# Patient Record
Sex: Female | Born: 1975 | Race: White | Hispanic: No | Marital: Married
Health system: Southern US, Community
[De-identification: ages and names within clinical notes are randomized; demographics above are authoritative.]

---

## 2005-10-15 ENCOUNTER — Inpatient Hospital Stay (HOSPITAL_COMMUNITY): Admission: AD | Admit: 2005-10-15 | Discharge: 2005-10-17 | Payer: Self-pay | Admitting: Obstetrics & Gynecology

## 2008-11-25 ENCOUNTER — Inpatient Hospital Stay (HOSPITAL_COMMUNITY): Admission: AD | Admit: 2008-11-25 | Discharge: 2008-11-26 | Payer: Self-pay | Admitting: Obstetrics and Gynecology

## 2010-04-19 LAB — CBC
HCT: 35.9 % — ABNORMAL LOW (ref 36.0–46.0)
HCT: 39 % (ref 36.0–46.0)
Hemoglobin: 13.6 g/dL (ref 12.0–15.0)
MCHC: 34.4 g/dL (ref 30.0–36.0)
MCHC: 34.7 g/dL (ref 30.0–36.0)
MCV: 96.8 fL (ref 78.0–100.0)
MCV: 97.1 fL (ref 78.0–100.0)
Platelets: 180 10*3/uL (ref 150–400)
RBC: 4.03 MIL/uL (ref 3.87–5.11)
RDW: 12.8 % (ref 11.5–15.5)
RDW: 13.1 % (ref 11.5–15.5)
WBC: 11.6 10*3/uL — ABNORMAL HIGH (ref 4.0–10.5)

## 2016-05-25 ENCOUNTER — Other Ambulatory Visit: Payer: Self-pay | Admitting: Obstetrics and Gynecology

## 2016-05-25 DIAGNOSIS — R928 Other abnormal and inconclusive findings on diagnostic imaging of breast: Secondary | ICD-10-CM

## 2016-06-01 ENCOUNTER — Ambulatory Visit
Admission: RE | Admit: 2016-06-01 | Discharge: 2016-06-01 | Disposition: A | Discharge status: Home / Self Care | Payer: Self-pay | Source: Ambulatory Visit | Attending: Obstetrics and Gynecology | Admitting: Obstetrics and Gynecology

## 2016-06-01 DIAGNOSIS — R928 Other abnormal and inconclusive findings on diagnostic imaging of breast: Secondary | ICD-10-CM

## 2018-03-07 IMAGING — MG 2D DIGITAL DIAGNOSTIC BILATERAL MAMMOGRAM WITH CAD AND ADJUNCT T
8 of 12 series · 8 of 28 positions shown · non-contrast
Comparison: Baseline screening mammogram dated 05/24/2016.

CLINICAL DATA: Bilateral screening recall for a right breast
asymmetry and possible left breast mass.

EXAM:
2D DIGITAL DIAGNOSTIC BILATERAL MAMMOGRAM WITH CAD AND ADJUNCT TOMO
BILATERAL BREAST ULTRASOUND

[R CC]
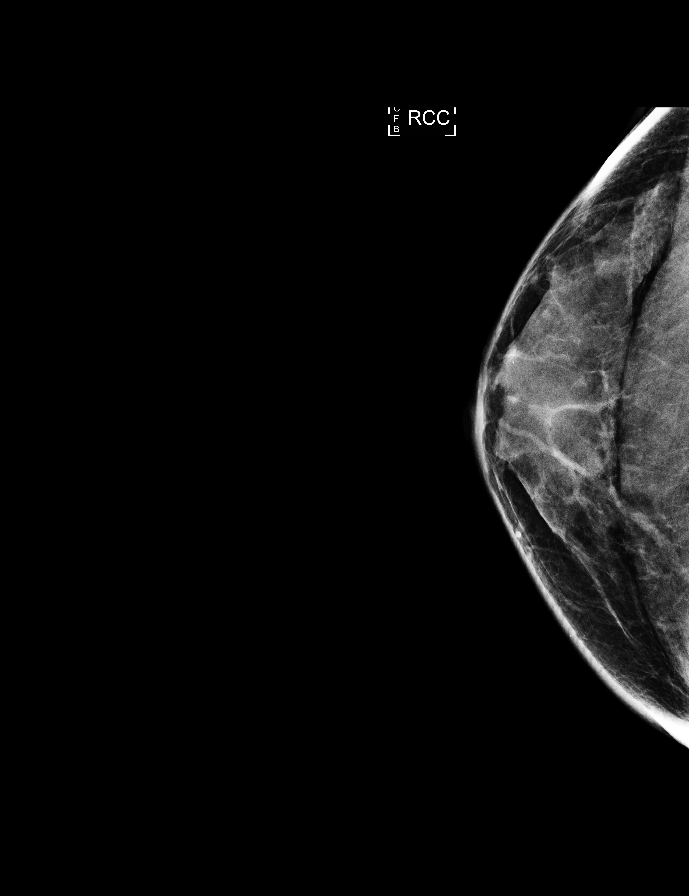

[L CC]
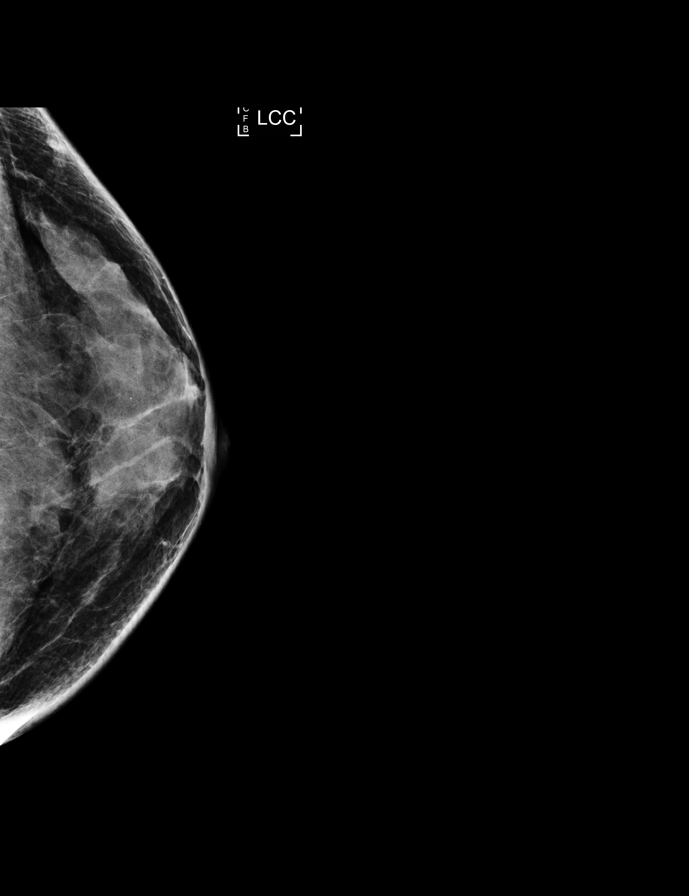

[R MLO]
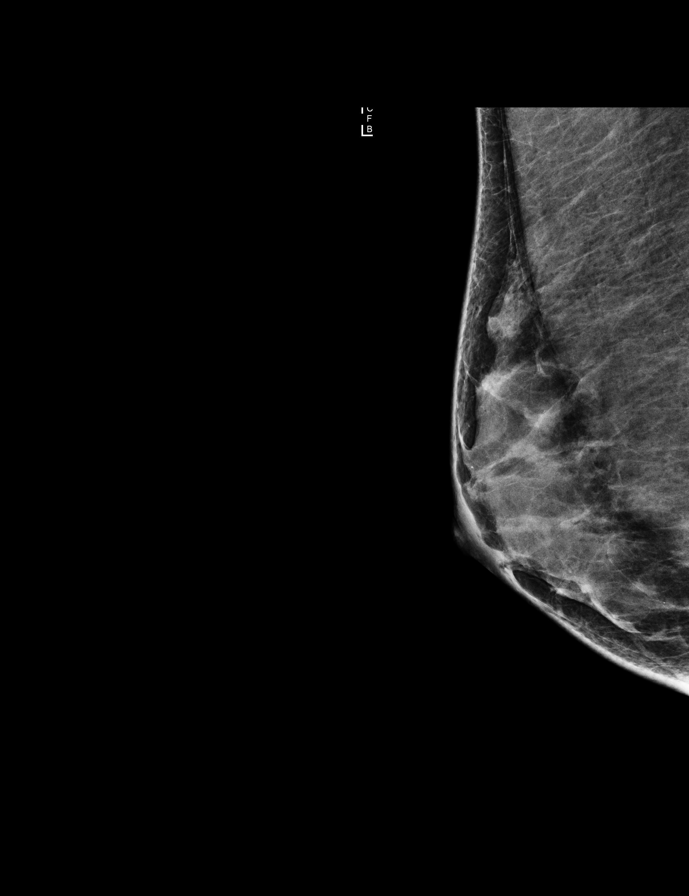

[L MLO]
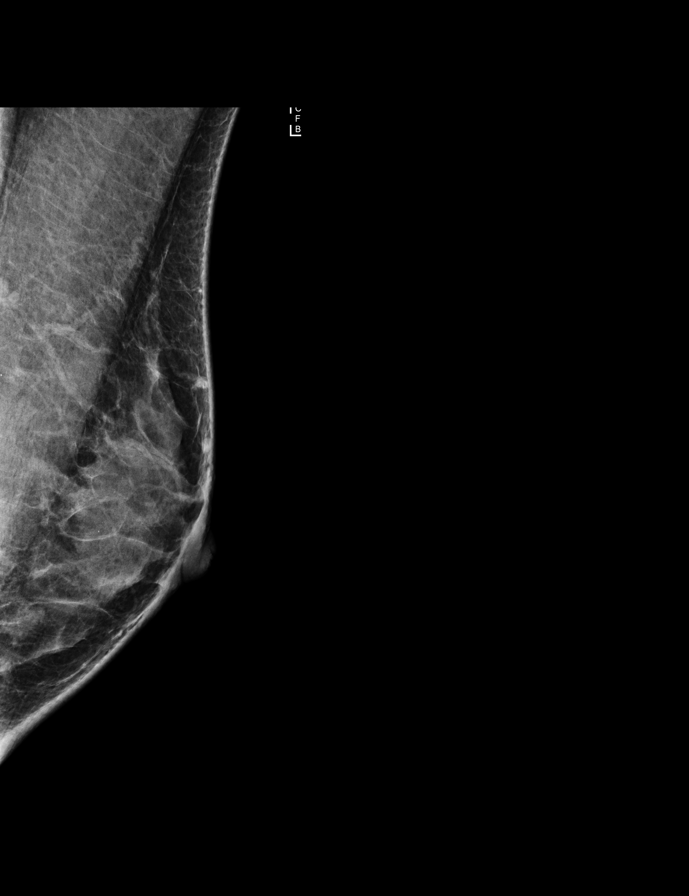

[L MLO synth-2D]
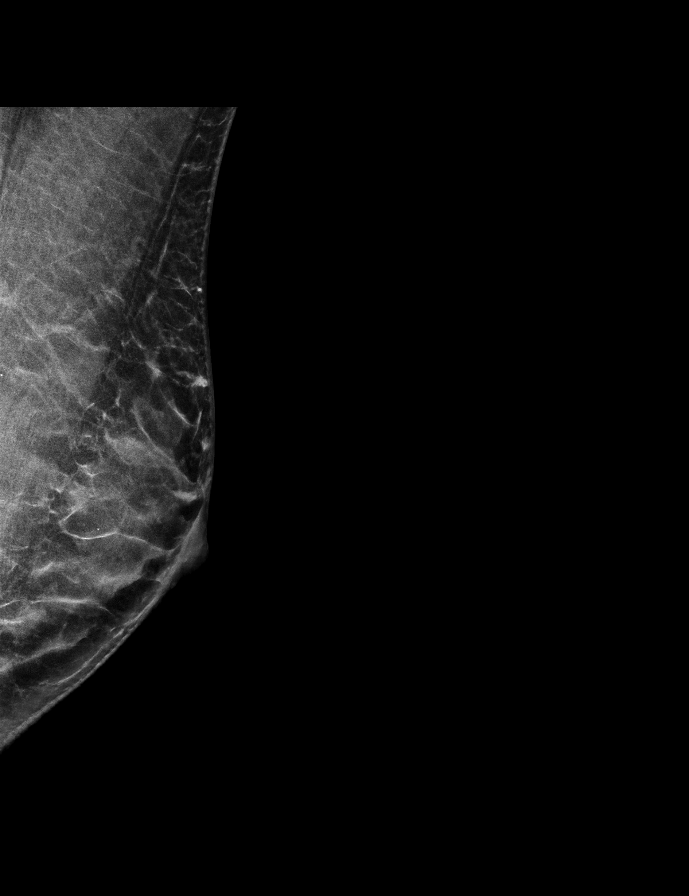

[L CC synth-2D]
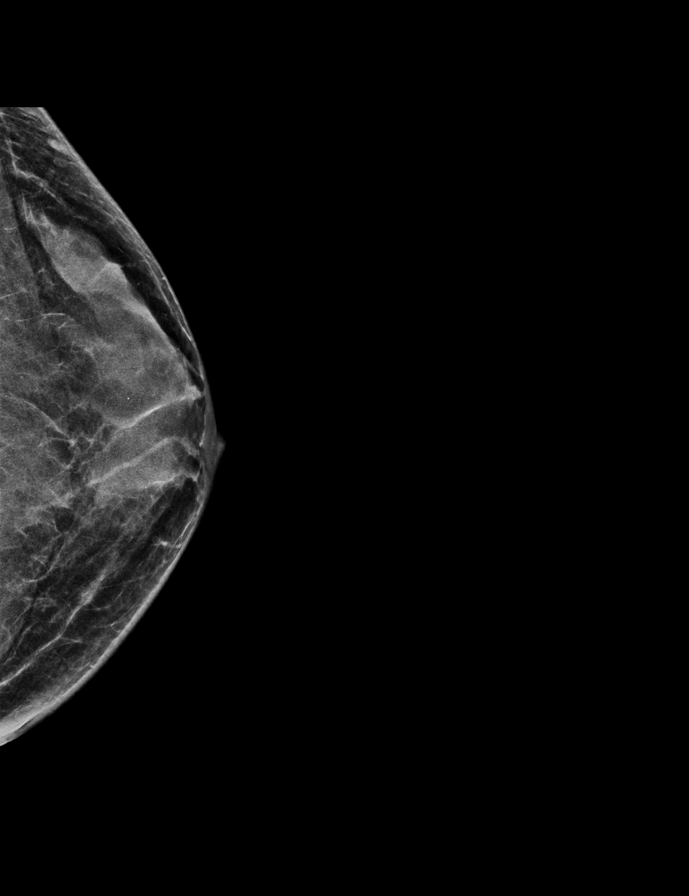

[R CC synth-2D]
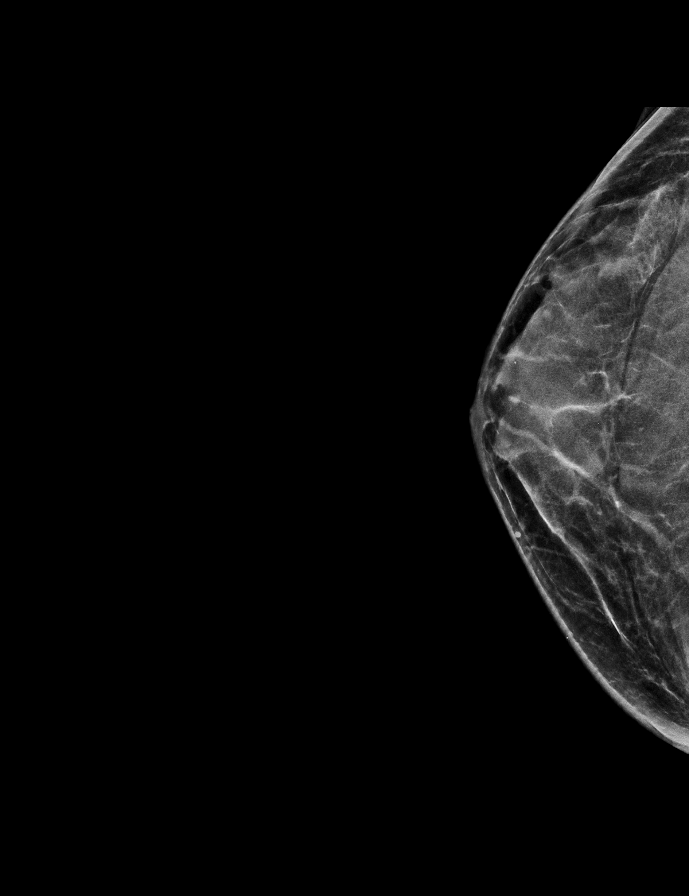

[R MLO synth-2D]
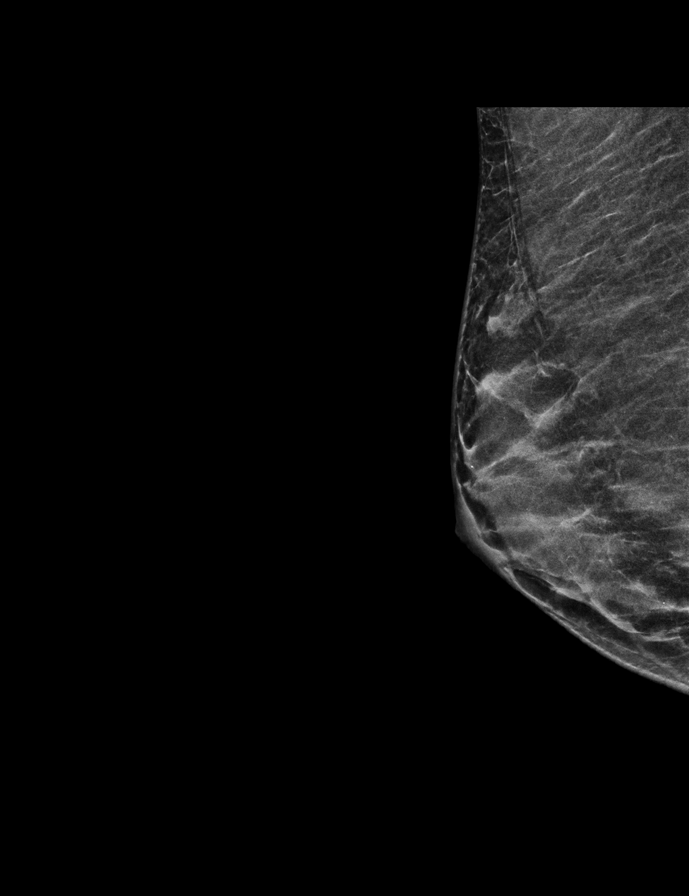

[8 of 28 positions shown; findings below may reference images not displayed]

ACR Breast Density Category d: The breast tissue is extremely dense,
which lowers the sensitivity of mammography.
FINDINGS: Ultrasound targeted to the left breast at 4 o'clock demonstrates a
superficial anechoic oval mass measuring 6 x 3 x 4 mm consistent
with a benign cyst. Ultrasound of the upper-outer quadrant of the
right breast demonstrates a benign-appearing lymph node with a
preserved fatty hilum and a cortex of normal thickness.

Mammographic images were processed with CAD.
IMPRESSION: 1. The asymmetry in the right upper outer quadrant corresponds with
dense breast tissue and a benign lymph node.

2.  The mass in the left breast corresponds with a benign cyst.

3. There is no mammographic or targeted sonographic evidence of
malignancy in the bilateral breasts.

RECOMMENDATION:
Screening mammogram in one year.(Code:R3-N-GOJ)

I have discussed the findings and recommendations with the patient.
Results were also provided in writing at the conclusion of the
visit. If applicable, a reminder letter will be sent to the patient
regarding the next appointment.

BI-RADS CATEGORY  2: Benign.

## 2018-03-07 IMAGING — US ULTRASOUND RIGHT BREAST LIMITED
1 series · 6 of 6 positions shown · non-contrast
Comparison: Baseline screening mammogram dated 05/24/2016.

CLINICAL DATA: Bilateral screening recall for a right breast
asymmetry and possible left breast mass.

EXAM:
2D DIGITAL DIAGNOSTIC BILATERAL MAMMOGRAM WITH CAD AND ADJUNCT TOMO
BILATERAL BREAST ULTRASOUND

[Series 1: ultrasound right breast limited · 0.03mm/px · 6 of 6 slices shown]
[im 1/6]
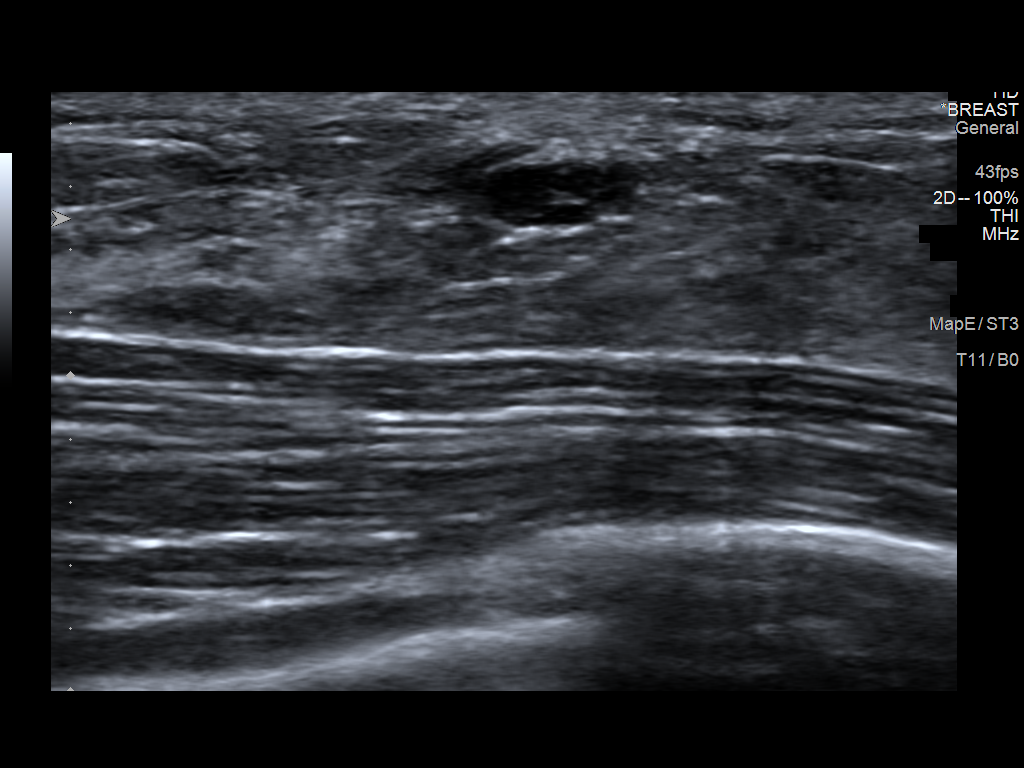
[im 2/6]
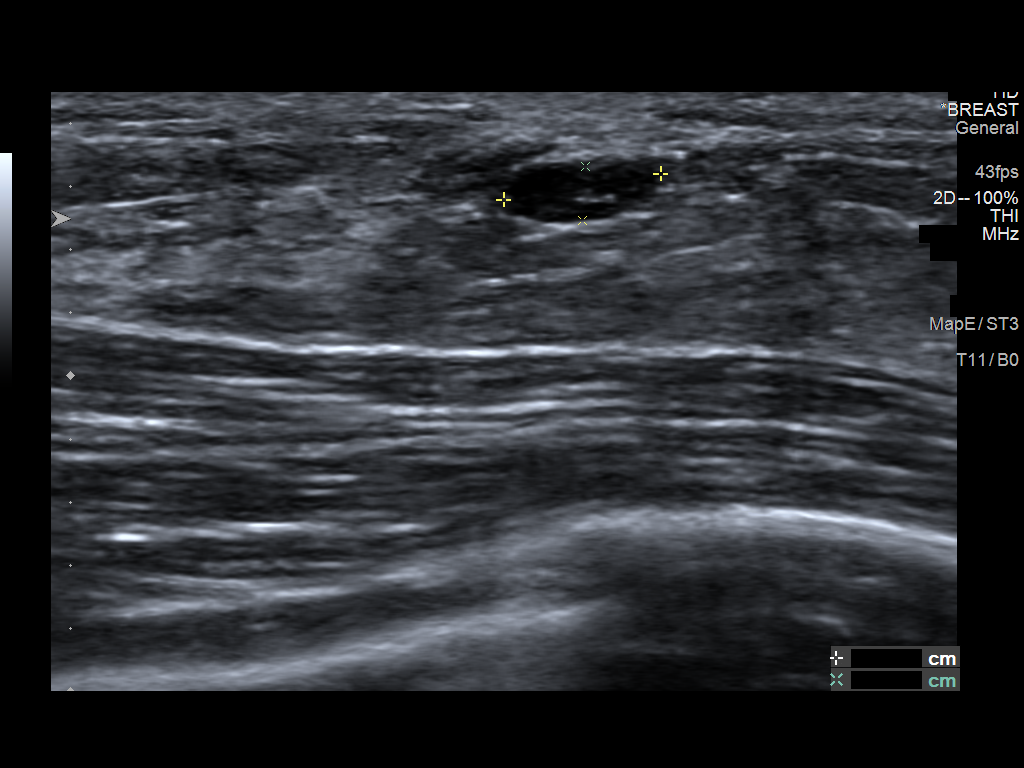
[im 3/6]
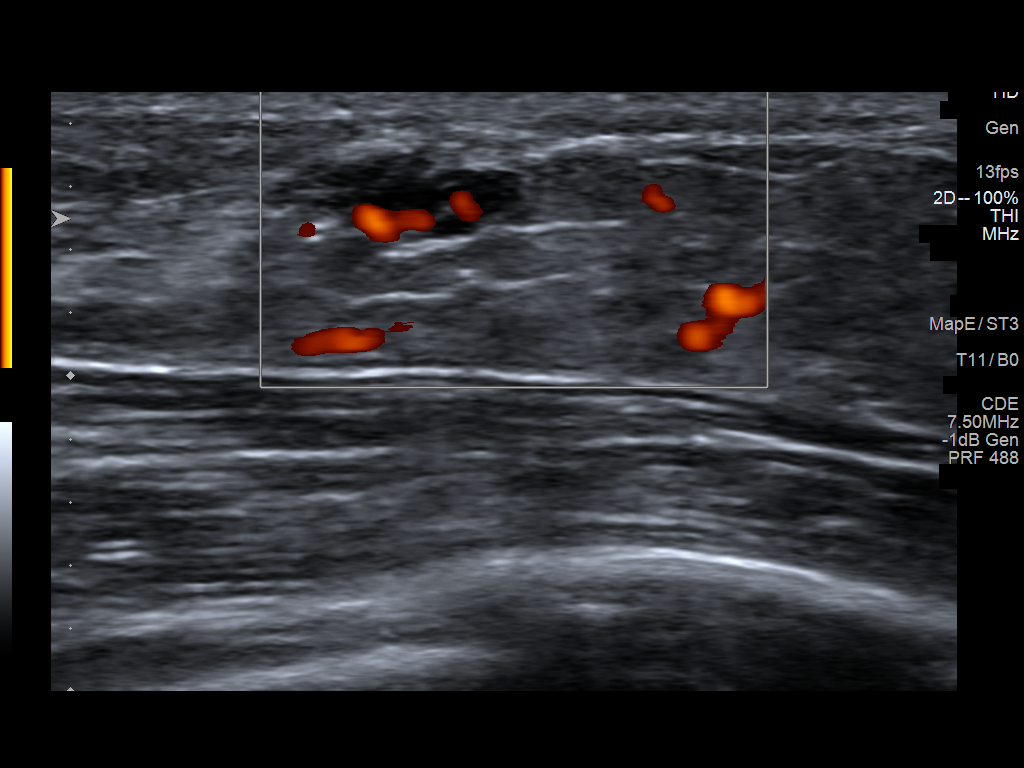
[im 4/6]
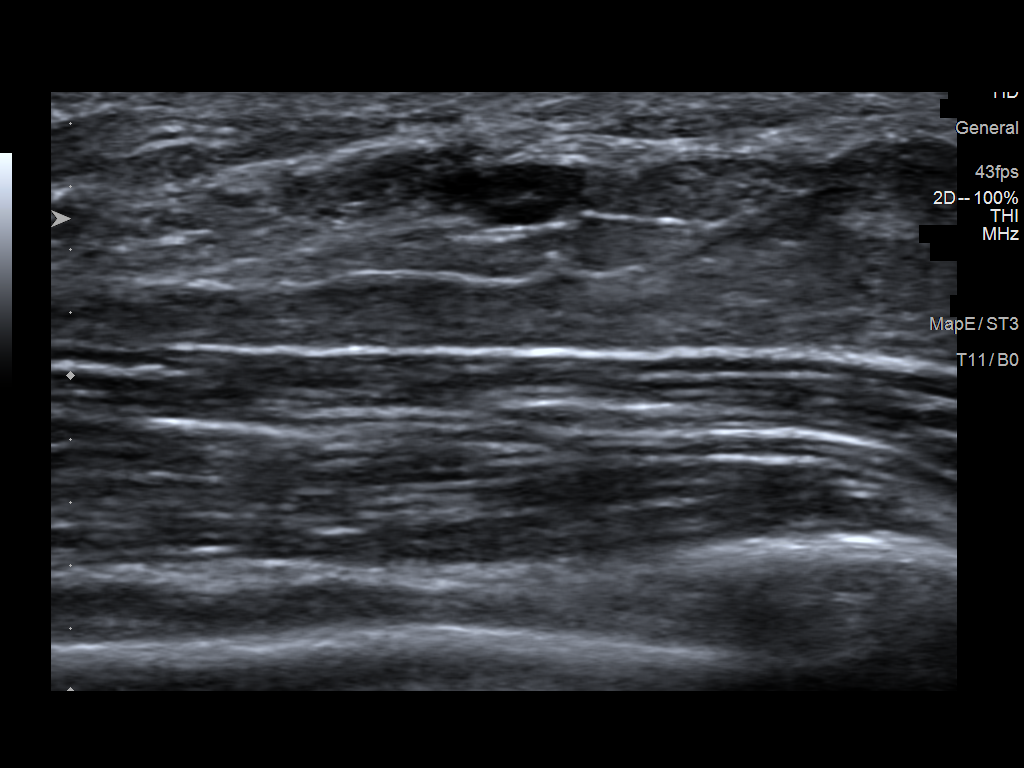
[im 5/6]
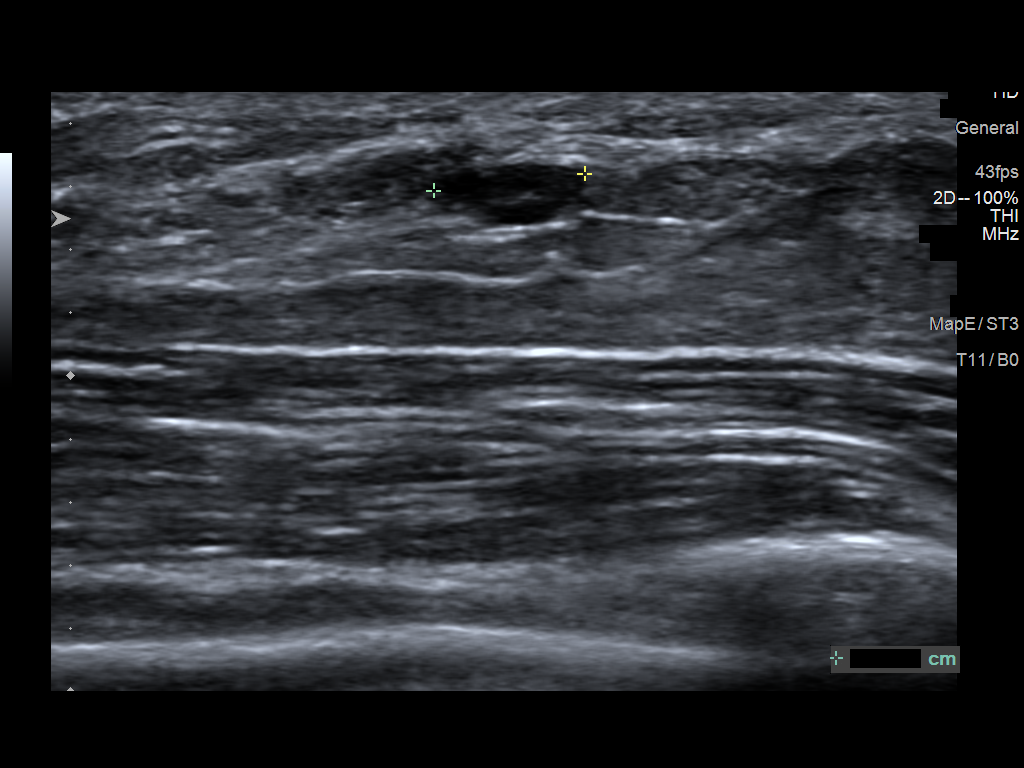
[im 6/6]
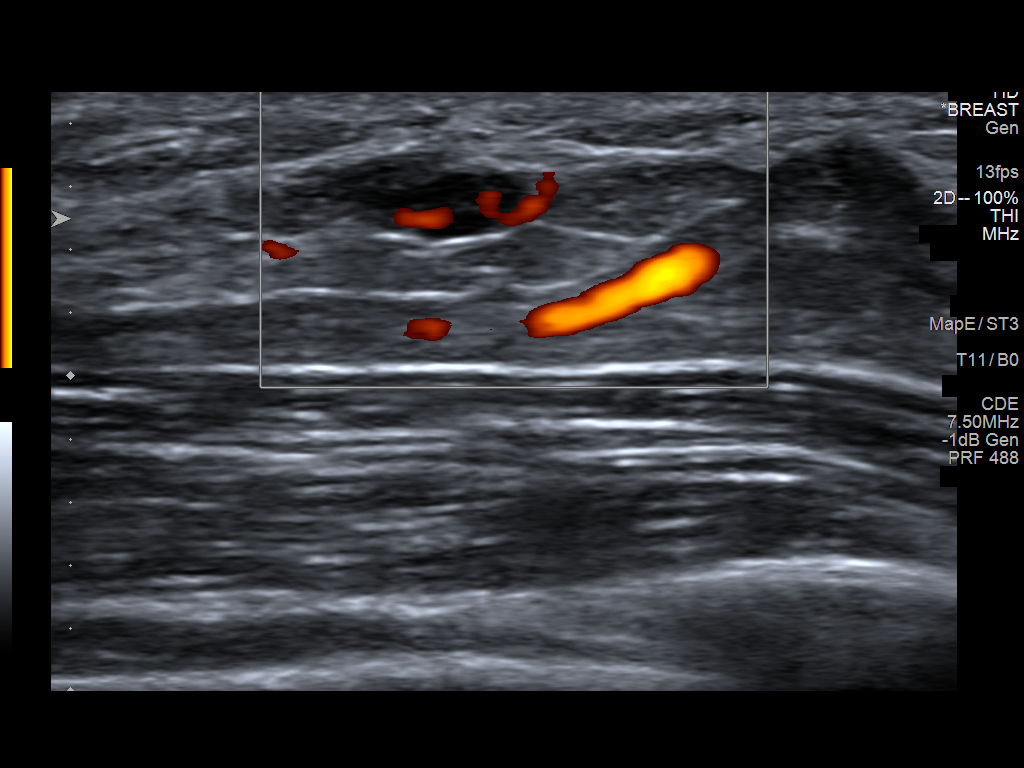

[6 of 6 positions shown; findings below may reference images not displayed]

ACR Breast Density Category d: The breast tissue is extremely dense,
which lowers the sensitivity of mammography.
FINDINGS: Ultrasound targeted to the left breast at 4 o'clock demonstrates a
superficial anechoic oval mass measuring 6 x 3 x 4 mm consistent
with a benign cyst. Ultrasound of the upper-outer quadrant of the
right breast demonstrates a benign-appearing lymph node with a
preserved fatty hilum and a cortex of normal thickness.

Mammographic images were processed with CAD.
IMPRESSION: 1. The asymmetry in the right upper outer quadrant corresponds with
dense breast tissue and a benign lymph node.

2.  The mass in the left breast corresponds with a benign cyst.

3. There is no mammographic or targeted sonographic evidence of
malignancy in the bilateral breasts.

RECOMMENDATION:
Screening mammogram in one year.(Code:R3-N-GOJ)

I have discussed the findings and recommendations with the patient.
Results were also provided in writing at the conclusion of the
visit. If applicable, a reminder letter will be sent to the patient
regarding the next appointment.

BI-RADS CATEGORY  2: Benign.

## 2018-04-07 ENCOUNTER — Ambulatory Visit: Payer: Commercial Managed Care - PPO | Admitting: Psychology

## 2018-04-22 ENCOUNTER — Ambulatory Visit: Payer: Commercial Managed Care - PPO | Admitting: Psychology

## 2018-05-06 ENCOUNTER — Ambulatory Visit: Payer: Commercial Managed Care - PPO | Admitting: Psychology

## 2019-03-14 ENCOUNTER — Ambulatory Visit: Payer: Self-pay | Attending: Internal Medicine

## 2019-03-14 DIAGNOSIS — Z23 Encounter for immunization: Secondary | ICD-10-CM | POA: Insufficient documentation

## 2019-03-14 NOTE — Progress Notes (Signed)
   Covid-19 Vaccination Clinic  Name:  Candice Garza    MRN: [id number] DOB: [birth year]  03/14/2019  Candice Garza was observed post Covid-19 immunization for 15 minutes without incidence. She was provided with Vaccine Information Sheet and instruction to access the V-Safe system.   Candice Garza was instructed to call 911 with any severe reactions post vaccine: Marland Kitchen Difficulty breathing  . Swelling of your face and throat  . A fast heartbeat  . A bad rash all over your body  . Dizziness and weakness    Immunizations Administered    Name Date Dose VIS Date Route   Pfizer COVID-19 Vaccine 03/14/2019  2:36 PM 0.3 mL 12/26/2018 Intramuscular   Manufacturer: [employer], [employer]   Lot: [id number]   NDC: [address]-1

## 2019-03-18 ENCOUNTER — Encounter: Payer: Self-pay | Admitting: *Deleted

## 2019-04-02 ENCOUNTER — Ambulatory Visit: Payer: Self-pay | Admitting: Physician Assistant

## 2019-04-04 ENCOUNTER — Ambulatory Visit: Payer: Self-pay | Attending: Internal Medicine

## 2019-04-04 DIAGNOSIS — Z23 Encounter for immunization: Secondary | ICD-10-CM

## 2019-04-04 NOTE — Progress Notes (Signed)
   Covid-19 Vaccination Clinic  Name:  Remas Sobel    MRN: [id number] DOB: [birth year]  04/04/2019  Ms. Randhawa was observed post Covid-19 immunization for 15 minutes without incident. She was provided with Vaccine Information Sheet and instruction to access the V-Safe system.   Ms. Weier was instructed to call 911 with any severe reactions post vaccine: Marland Kitchen Difficulty breathing  . Swelling of face and throat  . A fast heartbeat  . A bad rash all over body  . Dizziness and weakness   Immunizations Administered    Name Date Dose VIS Date Route   Pfizer COVID-19 Vaccine 04/04/2019  8:25 AM 0.3 mL 12/26/2018 Intramuscular   Manufacturer: [employer], [employer]   Lot: [id number]   NDC: [address]-8
